# Patient Record
Sex: Female | Born: 1990 | Race: Black or African American | Hispanic: No | Marital: Single | Smoking: Never smoker
Health system: Southern US, Community
[De-identification: ages and names within clinical notes are randomized; demographics above are authoritative.]

---

## 2015-10-01 ENCOUNTER — Emergency Department (HOSPITAL_COMMUNITY)
Admission: EM | Admit: 2015-10-01 | Discharge: 2015-10-01 | Disposition: A | Discharge status: Home / Self Care | Payer: BLUE CROSS/BLUE SHIELD | Attending: Emergency Medicine | Admitting: Emergency Medicine

## 2015-10-01 ENCOUNTER — Emergency Department (HOSPITAL_COMMUNITY): Payer: BLUE CROSS/BLUE SHIELD

## 2015-10-01 ENCOUNTER — Encounter (HOSPITAL_COMMUNITY): Payer: Self-pay

## 2015-10-01 DIAGNOSIS — R1032 Left lower quadrant pain: Secondary | ICD-10-CM | POA: Diagnosis present

## 2015-10-01 DIAGNOSIS — Z3202 Encounter for pregnancy test, result negative: Secondary | ICD-10-CM | POA: Diagnosis not present

## 2015-10-01 DIAGNOSIS — D259 Leiomyoma of uterus, unspecified: Secondary | ICD-10-CM | POA: Diagnosis not present

## 2015-10-01 DIAGNOSIS — Z79818 Long term (current) use of other agents affecting estrogen receptors and estrogen levels: Secondary | ICD-10-CM | POA: Insufficient documentation

## 2015-10-01 LAB — BASIC METABOLIC PANEL
Anion gap: 6 (ref 5–15)
BUN: 8 mg/dL (ref 6–20)
CHLORIDE: 105 mmol/L (ref 101–111)
CO2: 23 mmol/L (ref 22–32)
CREATININE: 0.76 mg/dL (ref 0.44–1.00)
Calcium: 8.7 mg/dL — ABNORMAL LOW (ref 8.9–10.3)
GFR calc Af Amer: >60 mL/min (ref >60)
GFR calc non Af Amer: >60 mL/min (ref >60)
GLUCOSE: 99 mg/dL (ref 65–99)
Potassium: 3.7 mmol/L (ref 3.5–5.1)
SODIUM: 134 mmol/L — AB (ref 135–145)

## 2015-10-01 LAB — CBC WITH DIFFERENTIAL/PLATELET
Basophils Absolute: 0.1 10*3/uL (ref 0.0–0.1)
Basophils Relative: 1 %
EOS ABS: 0.3 10*3/uL (ref 0.0–0.7)
EOS PCT: 6 %
HCT: 37.9 % (ref 36.0–46.0)
Hemoglobin: 12 g/dL (ref 12.0–15.0)
LYMPHS ABS: 2.2 10*3/uL (ref 0.7–4.0)
LYMPHS PCT: 43 %
MCH: 25.5 pg — AB (ref 26.0–34.0)
MCHC: 31.7 g/dL (ref 30.0–36.0)
MCV: 80.5 fL (ref 78.0–100.0)
MONO ABS: 0.3 10*3/uL (ref 0.1–1.0)
MONOS PCT: 7 %
Neutro Abs: 2.3 10*3/uL (ref 1.7–7.7)
Neutrophils Relative %: 43 %
PLATELETS: 280 10*3/uL (ref 150–400)
RBC: 4.71 MIL/uL (ref 3.87–5.11)
RDW: 14.3 % (ref 11.5–15.5)
WBC: 5.2 10*3/uL (ref 4.0–10.5)

## 2015-10-01 LAB — URINALYSIS, ROUTINE W REFLEX MICROSCOPIC
Bilirubin Urine: NEGATIVE
GLUCOSE, UA: NEGATIVE mg/dL
Hgb urine dipstick: NEGATIVE
Ketones, ur: NEGATIVE mg/dL
LEUKOCYTES UA: NEGATIVE
Nitrite: NEGATIVE
PROTEIN: NEGATIVE mg/dL
SPECIFIC GRAVITY, URINE: 1.007 (ref 1.005–1.030)
pH: 6 (ref 5.0–8.0)

## 2015-10-01 LAB — POC URINE PREG, ED: PREG TEST UR: NEGATIVE

## 2015-10-01 MED ORDER — ONDANSETRON 4 MG PO TBDP
4.0000 mg | ORAL_TABLET | Freq: Once | ORAL | Status: AC
  Administered 2015-10-01: 4 mg via ORAL
  Filled 2015-10-01: qty 1

## 2015-10-01 MED ORDER — HYDROCODONE-ACETAMINOPHEN 5-325 MG PO TABS: 2.0000 | ORAL_TABLET | ORAL | Status: AC | PRN

## 2015-10-01 MED ORDER — OXYCODONE-ACETAMINOPHEN 5-325 MG PO TABS
1.0000 | ORAL_TABLET | Freq: Once | ORAL | Status: AC
  Administered 2015-10-01: 1 via ORAL
  Filled 2015-10-01: qty 1

## 2015-10-01 NOTE — ED Notes (Signed)
Tech at Bedside drawing blood

## 2015-10-01 NOTE — ED Provider Notes (Signed)
CSN: [id number]     Arrival date & time 10/01/15  [id number] History   First MD Initiated Contact with Patient 10/01/15 0818     Chief Complaint  Patient presents with  . Abdominal Pain   (Consider location/radiation/quality/duration/timing/severity/associated sxs/prior Treatment) The history is provided by the patient. No language interpreter was used.   Ms. Mow is a 24 y.o female with no past medical history who presents to the ED for nausea and gradual onset left lower quadrant abdominal pain for the past 12 hours. Nothing makes her symptoms better or worse. Her last menstrual period was 3 and half weeks ago. She denies being pregnant. She states she is on birth control pills. Her last bowel movement was yesterday morning. She denies any fever, vomiting, hematochezia, diarrhea, constipation, dysuria, urinary frequency, hematuria, vaginal discharge or bleeding, vaginal itching or odor.  History reviewed. No pertinent past medical history. No past surgical history on file. No family history on file. Social History  Substance Use Topics  . Smoking status: Never Smoker   . Smokeless tobacco: None  . Alcohol Use: No   OB History    No data available     Review of Systems  Constitutional: Negative for fever.  Gastrointestinal: Positive for nausea and abdominal pain. Negative for vomiting, diarrhea, constipation and blood in stool.  All other systems reviewed and are negative.     Allergies  Review of patient's allergies indicates no known allergies.  Home Medications   Prior to Admission medications   Medication Sig Start Date End Date Taking? Authorizing Provider  [place] 28 0.25-35 MG-MCG tablet Take 1 tablet by mouth daily. 09/20/15  Yes Historical Provider, MD  HYDROcodone-acetaminophen (NORCO/VICODIN) 5-325 MG tablet Take 2 tablets by mouth every 4 (four) hours as needed. 10/01/15   Juniper Snyders Patel-Mills, PA-C   BP 114/68 mmHg  Pulse 67  Temp(Src) 98.4 F (36.9 C) (Oral)   Resp 18  SpO2 100%  LMP 09/08/2015 (Approximate) Physical Exam  Constitutional: She is oriented to person, place, and time. She appears well-developed and well-nourished.  HENT:  Head: Normocephalic and atraumatic.  Eyes: Conjunctivae are normal.  Neck: Normal range of motion. Neck supple.  Cardiovascular: Normal rate, regular rhythm and normal heart sounds.   Pulmonary/Chest: Effort normal and breath sounds normal.  Abdominal: Soft. Normal appearance. She exhibits no distension. There is tenderness. There is no rebound, no guarding and no CVA tenderness.    Left lower quadrant abdominal tenderness to palpation. Normal appearance. No abdominal distention. No guarding or rebound. No CVA tenderness.  Musculoskeletal: Normal range of motion.  Neurological: She is alert and oriented to person, place, and time.  Skin: Skin is warm and dry.  Psychiatric: She has a normal mood and affect.  Nursing note and vitals reviewed.   ED Course  Procedures (including critical care time) Labs Review Labs Reviewed  BASIC METABOLIC PANEL - Abnormal; Notable for the following:    Sodium 134 (*)    Calcium 8.7 (*)    All other components within normal limits  CBC WITH DIFFERENTIAL/PLATELET - Abnormal; Notable for the following:    MCH 25.5 (*)    All other components within normal limits  URINALYSIS, ROUTINE W REFLEX MICROSCOPIC (NOT AT [facility])  POC URINE PREG, ED    Imaging Review US Transvaginal Non-ob  10/01/2015  CLINICAL DATA:  Left lower quadrant pain EXAM: TRANSABDOMINAL AND TRANSVAGINAL ULTRASOUND OF PELVIS DOPPLER ULTRASOUND OF OVARIES TECHNIQUE: Both transabdominal and transvaginal ultrasound examinations of the pelvis were  performed. Transabdominal technique was performed for global imaging of the pelvis including uterus, ovaries, adnexal regions, and pelvic cul-de-sac. It was necessary to proceed with endovaginal exam following the transabdominal exam to visualize the ovaries. Color and  duplex Doppler ultrasound was utilized to evaluate blood flow to the ovaries. COMPARISON:  None. FINDINGS: Uterus Measurements: 8.2 x 4.5 x 4.8 cm. There is a fibroid within the left side of the fundus measuring 2.5 x 2.6 x 2.9 cm. Endometrium Thickness: 1.6 cm.  No focal abnormality visualized. Right ovary Measurements: 3 x 1.6 x 2.1 cm. Normal appearance/no adnexal mass. Left ovary Measurements: 3.6 x 2.2 x 3.0 cm. Normal appearance/no adnexal mass. Pulsed Doppler evaluation of both ovaries demonstrates normal low-resistance arterial and venous waveforms. Other findings No abnormal free fluid. IMPRESSION: 1. No evidence for ovarian torsion. 2. Left fundal fibroid. Electronically Signed   By: Kerby Moors M.D.   On: 10/01/2015 12:13   US Pelvis Complete  10/01/2015  CLINICAL DATA:  Left lower quadrant pain EXAM: TRANSABDOMINAL AND TRANSVAGINAL ULTRASOUND OF PELVIS DOPPLER ULTRASOUND OF OVARIES TECHNIQUE: Both transabdominal and transvaginal ultrasound examinations of the pelvis were performed. Transabdominal technique was performed for global imaging of the pelvis including uterus, ovaries, adnexal regions, and pelvic cul-de-sac. It was necessary to proceed with endovaginal exam following the transabdominal exam to visualize the ovaries. Color and duplex Doppler ultrasound was utilized to evaluate blood flow to the ovaries. COMPARISON:  None. FINDINGS: Uterus Measurements: 8.2 x 4.5 x 4.8 cm. There is a fibroid within the left side of the fundus measuring 2.5 x 2.6 x 2.9 cm. Endometrium Thickness: 1.6 cm.  No focal abnormality visualized. Right ovary Measurements: 3 x 1.6 x 2.1 cm. Normal appearance/no adnexal mass. Left ovary Measurements: 3.6 x 2.2 x 3.0 cm. Normal appearance/no adnexal mass. Pulsed Doppler evaluation of both ovaries demonstrates normal low-resistance arterial and venous waveforms. Other findings No abnormal free fluid. IMPRESSION: 1. No evidence for ovarian torsion. 2. Left fundal fibroid.  Electronically Signed   By: Kerby Moors M.D.   On: 10/01/2015 12:13   [place] Art/ven Flow Abd Pelv Doppler  10/01/2015  CLINICAL DATA:  Left lower quadrant pain EXAM: TRANSABDOMINAL AND TRANSVAGINAL ULTRASOUND OF PELVIS DOPPLER ULTRASOUND OF OVARIES TECHNIQUE: Both transabdominal and transvaginal ultrasound examinations of the pelvis were performed. Transabdominal technique was performed for global imaging of the pelvis including uterus, ovaries, adnexal regions, and pelvic cul-de-sac. It was necessary to proceed with endovaginal exam following the transabdominal exam to visualize the ovaries. Color and duplex Doppler ultrasound was utilized to evaluate blood flow to the ovaries. COMPARISON:  None. FINDINGS: Uterus Measurements: 8.2 x 4.5 x 4.8 cm. There is a fibroid within the left side of the fundus measuring 2.5 x 2.6 x 2.9 cm. Endometrium Thickness: 1.6 cm.  No focal abnormality visualized. Right ovary Measurements: 3 x 1.6 x 2.1 cm. Normal appearance/no adnexal mass. Left ovary Measurements: 3.6 x 2.2 x 3.0 cm. Normal appearance/no adnexal mass. Pulsed Doppler evaluation of both ovaries demonstrates normal low-resistance arterial and venous waveforms. Other findings No abnormal free fluid. IMPRESSION: 1. No evidence for ovarian torsion. 2. Left fundal fibroid. Electronically Signed   By: Kerby Moors M.D.   On: 10/01/2015 12:13   I have personally reviewed and evaluated these images and lab results as part of my medical decision-making.   EKG Interpretation None      MDM   Final diagnoses:  Uterine leiomyoma, unspecified location  Patient presents for llq abdominal pain and  nausea. She is well-appearing and vitals are stable. Her labs including urine were unremarkable. She is not pregnant. Ultrasound of the pelvis, transvaginal ultrasound, and art/Ven flow US show left uterine fibroid but no evidence of ovarian cyst, ovarian torsion or abscess. I do not believe this is colitis.  She has no  vomiting or bloody bowel movements, no diarrhea, or any other symptoms that would indicate this is bowel related.  Patient was told to take ibuprofen at home for pain and Percocet for breakthrough pain. Return precautions were discussed as well as follow-up at [facility] outpatient clinic and she verbally agrees with the plan. Filed Vitals:   10/01/15 1050 10/01/15 1236  BP: 113/68 114/68  Pulse: 77 67  Temp:  98.4 F (36.9 C)  Resp: 16 18   Medications  ondansetron (ZOFRAN-ODT) disintegrating tablet 4 mg (4 mg Oral Given 10/01/15 0838)  oxyCODONE-acetaminophen (PERCOCET/ROXICET) 5-325 MG per tablet 1 tablet (1 tablet Oral Given 10/01/15 0931)       Ottie Glazier, PA-C [phone]  Delora Fuel, MD [phone]

## 2015-10-01 NOTE — Discharge Instructions (Signed)
Uterine Fibroids Follow-up with women's outpatient clinic. Take Tylenol or Motrin for pain and hydrocodone for breakthrough pain. Uterine fibroids are tissue masses (tumors) that can develop in the womb (uterus). They are also called leiomyomas. This type of tumor is not cancerous (benign) and does not spread to other parts of the body outside of the pelvic area, which is between the hip bones. Occasionally, fibroids may develop in the fallopian tubes, in the cervix, or on the support structures (ligaments) that surround the uterus. You can have one or many fibroids. Fibroids can vary in size, weight, and where they grow in the uterus. Some can become quite large. Most fibroids do not require medical treatment. CAUSES A fibroid can develop when a single uterine cell keeps growing (replicating). Most cells in the human body have a control mechanism that keeps them from replicating without control. SIGNS AND SYMPTOMS Symptoms may include:   Heavy bleeding during your period.  Bleeding or spotting between periods.  Pelvic pain and pressure.  Bladder problems, such as needing to urinate more often (urinary frequency) or urgently.  Inability to reproduce offspring (infertility).  Miscarriages. DIAGNOSIS Uterine fibroids are diagnosed through a physical exam. Your health care provider may feel the lumpy tumors during a pelvic exam. Ultrasonography and an MRI may be done to determine the size, location, and number of fibroids. TREATMENT Treatment may include:  Watchful waiting. This involves getting the fibroid checked by your health care provider to see if it grows or shrinks. Follow your health care provider's recommendations for how often to have this checked.  Hormone medicines. These can be taken by mouth or given through an intrauterine device (IUD).  Surgery.  Removing the fibroids (myomectomy) or the uterus (hysterectomy).  Removing blood supply to the fibroids (uterine artery  embolization). If fibroids interfere with your fertility and you want to become pregnant, your health care provider may recommend having the fibroids removed.  HOME CARE INSTRUCTIONS  Keep all follow-up visits as directed by your health care provider. This is important.  Take medicines only as directed by your health care provider.  If you were prescribed a hormone treatment, take the hormone medicines exactly as directed.  Do not take aspirin, because it can cause bleeding.  Ask your health care provider about taking iron pills and increasing the amount of dark green, leafy vegetables in your diet. These actions can help to boost your blood iron levels, which may be affected by heavy menstrual bleeding.  Pay close attention to your period and tell your health care provider about any changes, such as:  Increased blood flow that requires you to use more pads or tampons than usual per month.  A change in the number of days that your period lasts per month.  A change in symptoms that are associated with your period, such as abdominal cramping or back pain. SEEK MEDICAL CARE IF:  You have pelvic pain, back pain, or abdominal cramps that cannot be controlled with medicines.  You have an increase in bleeding between and during periods.  You soak tampons or pads in a half hour or less.  You feel lightheaded, extra tired, or weak. SEEK IMMEDIATE MEDICAL CARE IF:  You faint.  You have a sudden increase in pelvic pain.   This information is not intended to replace advice given to you by your health care provider. Make sure you discuss any questions you have with your health care provider.   Document Released: 09/21/2000 Document Revised: 10/15/2014 Document  Reviewed: 03/23/2014 Elsevier Interactive Patient Education [employer].

## 2015-10-01 NOTE — ED Notes (Signed)
Patient transported to Ultrasound 

## 2015-10-01 NOTE — ED Notes (Signed)
She c/o llq abd. Pain x 12 hours.  She denies fever/dysuria and does c/o nausea, but denies vomiting.  She also denies diarrhea.

## 2015-10-01 NOTE — ED Notes (Addendum)
Transferred to [place]

## 2016-04-13 IMAGING — US US TRANSVAGINAL NON-OB
1 series · 13 of 25 positions shown · non-contrast
Comparison: None.

CLINICAL DATA: Left lower quadrant pain

EXAM:
TRANSABDOMINAL AND TRANSVAGINAL ULTRASOUND OF PELVIS
DOPPLER ULTRASOUND OF OVARIES
TECHNIQUE: Both transabdominal and transvaginal ultrasound examinations of the
pelvis were performed. Transabdominal technique was performed for
global imaging of the pelvis including uterus, ovaries, adnexal
regions, and pelvic cul-de-sac.
It was necessary to proceed with endovaginal exam following the
transabdominal exam to visualize the ovaries.. Color and duplex
Doppler ultrasound was utilized to evaluate blood flow to the
ovaries.

[Series 1: us transvaginal non-ob · 0.24mm/px · 64 acquisitions, 13 frames shown]
[im 1/64]
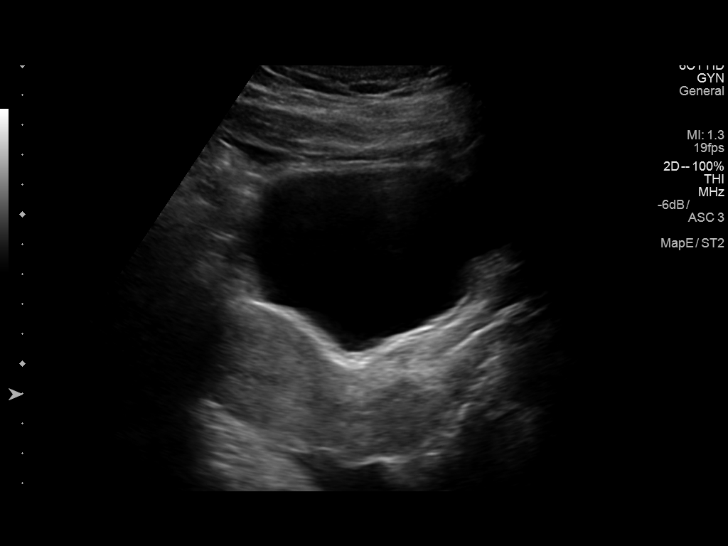
[im 6/64]
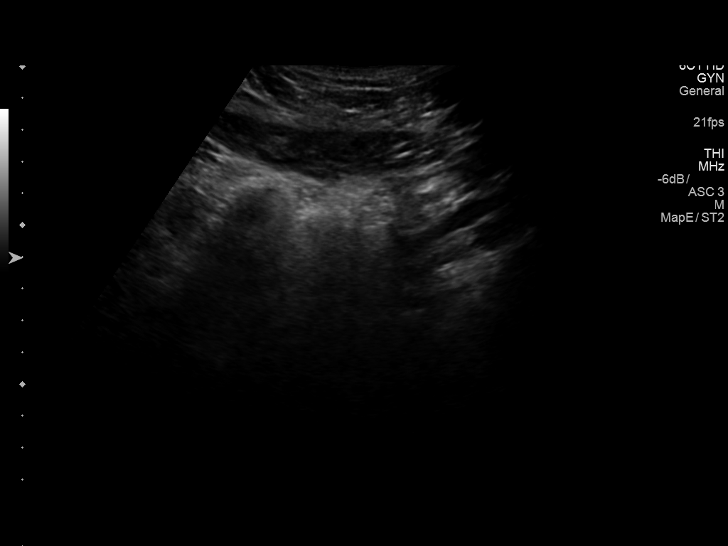
[im 11/64]
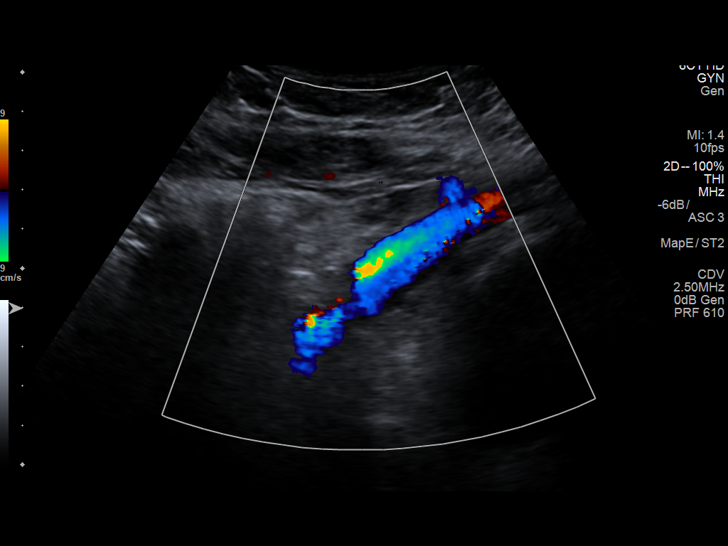
[im 16/64]
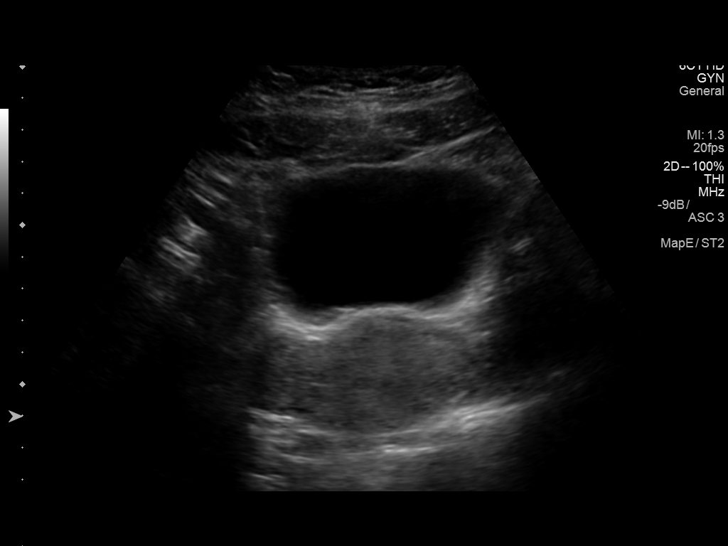
[im 22/64]
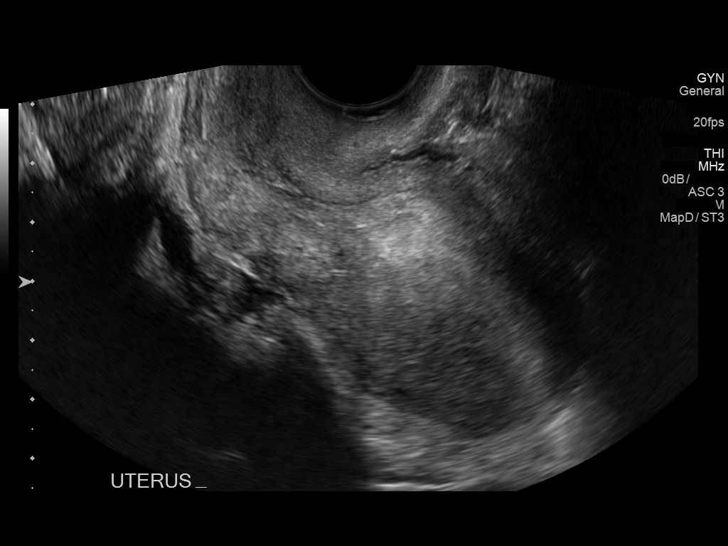
[im 27/64]
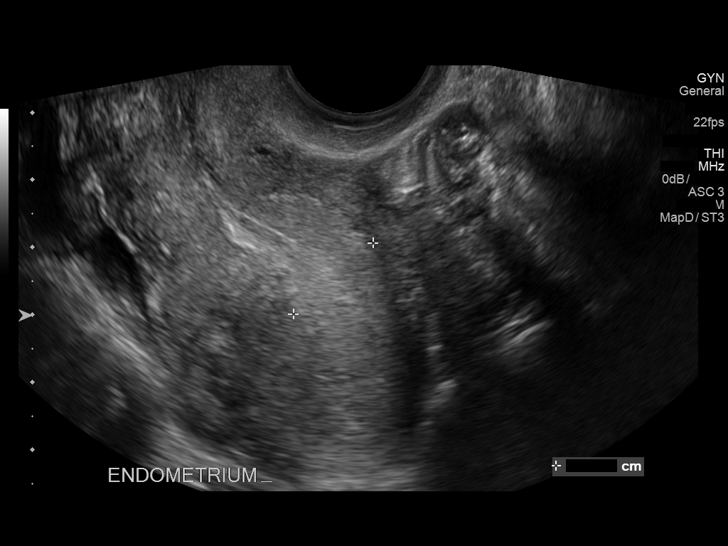
[im 32/64]
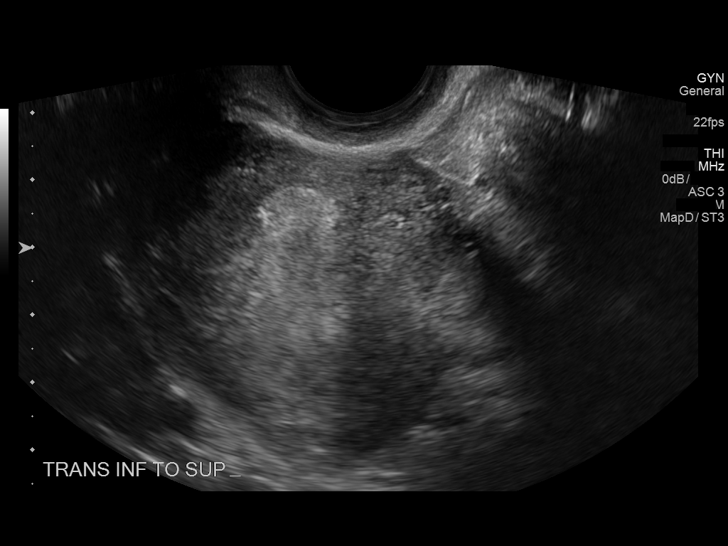
[im 37/64]
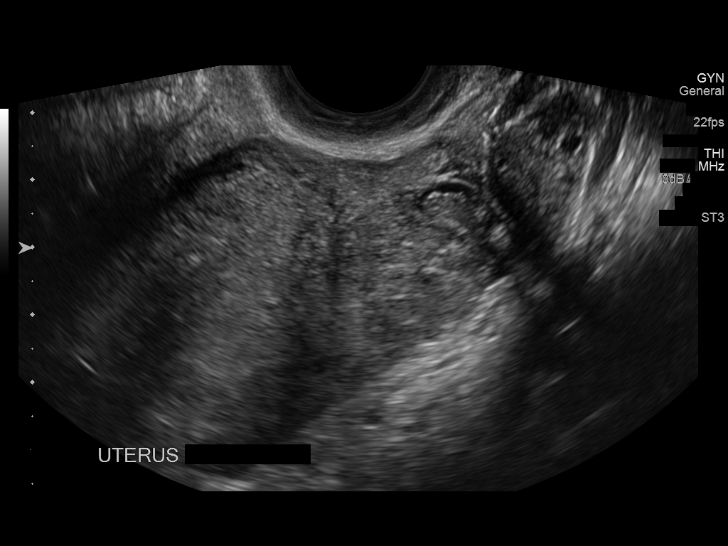
[im 43/64]
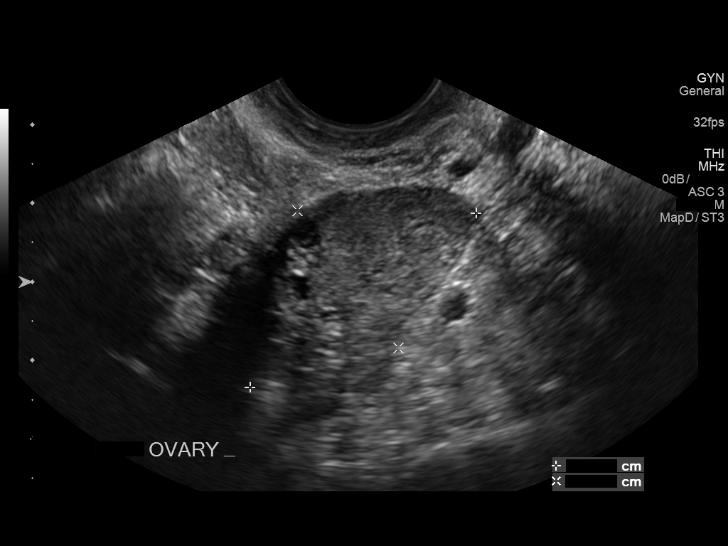
[im 48/64]
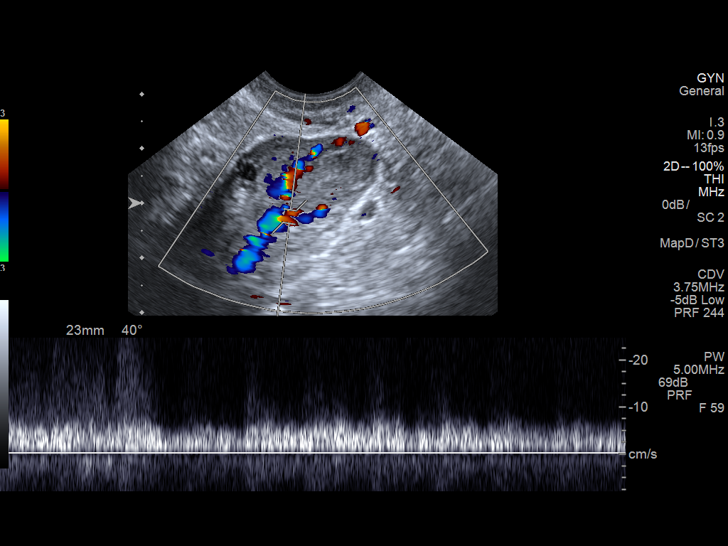
[im 53/64]
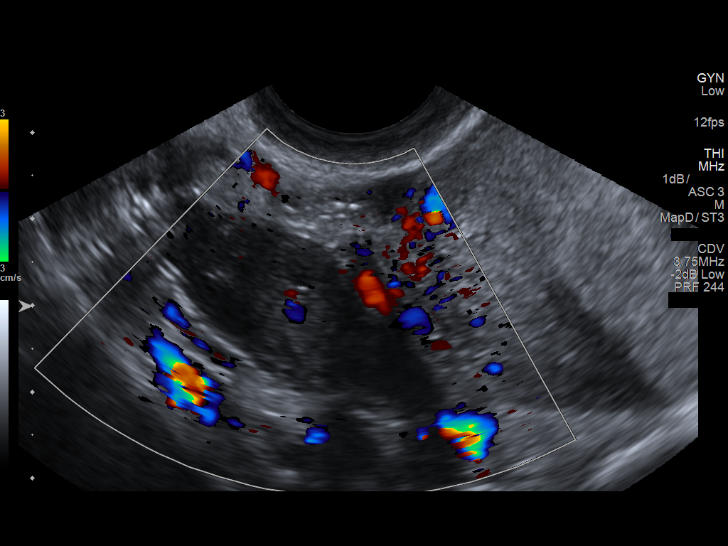
[im 58/64]
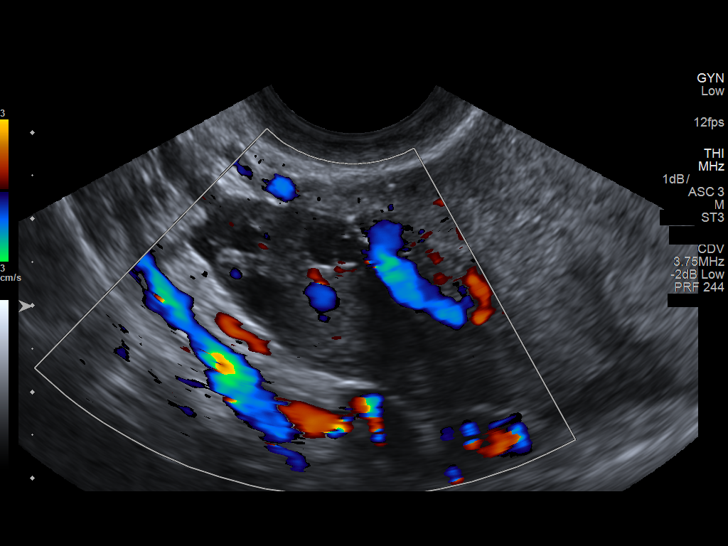
[im 64/64]
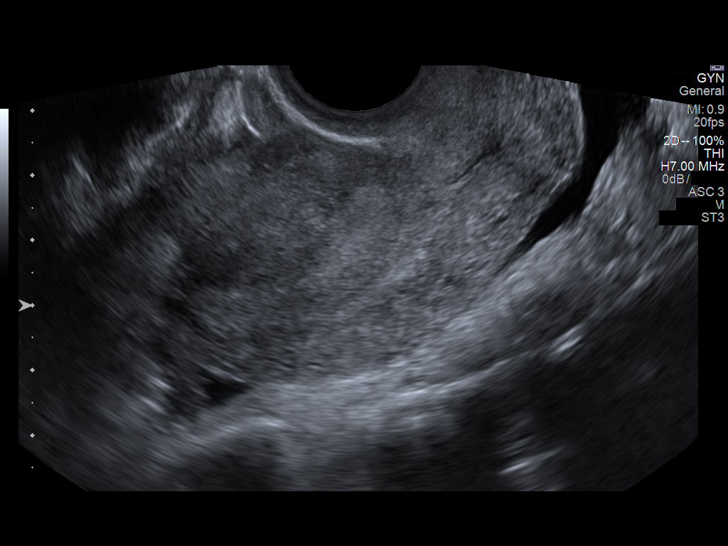

[13 of 25 positions shown; findings below may reference images not displayed]

FINDINGS: Uterus

Measurements: 8.2 x 4.5 x 4.8 cm. There is a fibroid within the left
side of the fundus measuring 2.5 x 2.6 x 2.9 cm.

Endometrium

Thickness: 1.6 cm..  No focal abnormality visualized.

Right ovary

Measurements: 3 x 1.6 x 2.1 cm. Normal appearance/no adnexal mass.

Left ovary

Measurements: 3.6 x 2.2 x 3.0 cm.. Normal appearance/no adnexal
mass.

Pulsed Doppler evaluation of both ovaries demonstrates normal
low-resistance arterial and venous waveforms.

Other findings

No abnormal free fluid.
IMPRESSION: 1. No evidence for ovarian torsion.
2. Left fundal fibroid.

## 2019-05-11 ENCOUNTER — Encounter (HOSPITAL_COMMUNITY): Payer: Self-pay | Admitting: *Deleted

## 2019-05-11 ENCOUNTER — Inpatient Hospital Stay
Admission: RE | Admit: 2019-05-11 | Discharge: 2019-05-11 | Disposition: A | Discharge status: Home / Self Care | Payer: BLUE CROSS/BLUE SHIELD | Source: Ambulatory Visit

## 2019-05-11 ENCOUNTER — Other Ambulatory Visit: Payer: Self-pay

## 2019-05-11 ENCOUNTER — Ambulatory Visit (HOSPITAL_COMMUNITY)
Admission: EM | Admit: 2019-05-11 | Discharge: 2019-05-11 | Disposition: A | Discharge status: Home / Self Care | Payer: Managed Care, Other (non HMO) | Attending: Emergency Medicine | Admitting: Emergency Medicine

## 2019-05-11 DIAGNOSIS — Z3202 Encounter for pregnancy test, result negative: Secondary | ICD-10-CM

## 2019-05-11 DIAGNOSIS — R1032 Left lower quadrant pain: Secondary | ICD-10-CM | POA: Insufficient documentation

## 2019-05-11 LAB — POCT URINALYSIS DIP (DEVICE)
Bilirubin Urine: NEGATIVE
Glucose, UA: NEGATIVE mg/dL
Ketones, ur: NEGATIVE mg/dL
Nitrite: NEGATIVE
Protein, ur: NEGATIVE mg/dL
Specific Gravity, Urine: >=1.03 (ref 1.005–1.030)
Urobilinogen, UA: 0.2 mg/dL (ref 0.0–1.0)
pH: 6 (ref 5.0–8.0)

## 2019-05-11 LAB — POCT PREGNANCY, URINE: Preg Test, Ur: NEGATIVE

## 2019-05-11 MED ORDER — POLYETHYLENE GLYCOL 3350 17 GM/SCOOP PO POWD: 17.0000 g | Freq: Every day | ORAL | 0 refills | Status: AC

## 2019-05-11 MED ORDER — SIMETHICONE 80 MG PO CHEW: 80.0000 mg | CHEWABLE_TABLET | Freq: Four times a day (QID) | ORAL | 0 refills | Status: AC | PRN

## 2019-05-11 NOTE — ED Triage Notes (Signed)
Patient reports abdominal pain, points to left lower quadrant, x several years. States that she was told it was fibroids. States that flatuance and bowel movements relieve discomfort. Patient has seen several providers for same and is unable to get diagnosis that helps. No pain with movement. States pain is sharp pain that can last 2-4 minutes have orgasm, will only last a couple seconds otherwise.

## 2019-05-11 NOTE — ED Provider Notes (Signed)
[place]    CSN: [id number] Arrival date & time: 05/11/19  5364      History   Chief Complaint Chief Complaint  Patient presents with  . Abdominal Pain    HPI Aimee Mills is a 28 y.o. female.   Aimee Mills presents with complaints of LLQ pain. It is sharp. Comes and goes. It feels better after passing gas or having a BM. She states that she has some constipation, yesterday had to strain to pass a BM, then this was followed by two looser stools. No blood in stool. Laying in the fetal position and passing gas can relieve the pain as well. She additionally feels it after she has an orgasm. No pain with intercourse. No vaginal complaints. She does have some urinary frequency. LMP 7/13. She is not on birthcontrol. She has seen her gynecologist for this in the past with imaging, saw a very small uterine leiomyoma which was not thought to be contributing to symptoms. She also completed pelvic floor physical therapy and was given intravaginal valium as they thought she was experiencing levator spasms. Has not had fevers. No current pain. No nausea or vomiting. Sometimes eating worsens the pain. States she eats a fairly healthy diet. Hasn't tried any other medications for symptoms. Hasn't seen her PCP or gyne in approximately 1 year.     ROS per HPI, negative if not otherwise mentioned.      History reviewed. No pertinent past medical history.  There are no active problems to display for this patient.   History reviewed. No pertinent surgical history.  OB History   No obstetric history on file.      Home Medications    Prior to Admission medications   Medication Sig Start Date End Date Taking? Authorizing Provider  HYDROcodone-acetaminophen (NORCO/VICODIN) 5-325 MG tablet Take 2 tablets by mouth every 4 (four) hours as needed. 10/01/15   Patel-Mills, Orvil Feil, PA-C  polyethylene glycol powder (GLYCOLAX/MIRALAX) 17 GM/SCOOP powder Take 17 g by mouth daily. 05/11/19    Zigmund Gottron, NP  simethicone (GAS-X) 80 MG chewable tablet Chew 1 tablet (80 mg total) by mouth every 6 (six) hours as needed for flatulence. 05/11/19   Zigmund Gottron, NP  [place] 28 0.25-35 MG-MCG tablet Take 1 tablet by mouth daily. 09/20/15   [provider]    Family History Family History  Problem Relation Age of Onset  . Healthy Mother   . Healthy Father     Social History Social History   Tobacco Use  . Smoking status: Never Smoker  Substance Use Topics  . Alcohol use: No  . Drug use: Not on file     Allergies   Patient has no known allergies.   Review of Systems Review of Systems   Physical Exam Triage Vital Signs ED Triage Vitals  Enc Vitals Group     BP 05/11/19 0945 125/75     Pulse Rate 05/11/19 0945 83     Resp 05/11/19 0945 16     Temp 05/11/19 0944 98.2 F (36.8 C)     Temp src --      SpO2 05/11/19 0945 98 %     Weight --      Height --      Head Circumference --      Peak Flow --      Pain Score 05/11/19 0949 4     Pain Loc --      Pain Edu? --  Excl. in GC? --    No data found.  Updated Vital Signs BP 125/75 (BP Location: Right Arm)   Pulse 83   Temp 98.2 F (36.8 C)   Resp 16   LMP 04/20/2019   SpO2 98%    Physical Exam Constitutional:      General: She is not in acute distress.    Appearance: She is well-developed.  Cardiovascular:     Rate and Rhythm: Normal rate and regular rhythm.     Heart sounds: Normal heart sounds.  Pulmonary:     Effort: Pulmonary effort is normal.     Breath sounds: Normal breath sounds.  Abdominal:     Palpations: Abdomen is soft. Abdomen is not rigid.     Tenderness: There is no abdominal tenderness. There is no guarding or rebound.  Genitourinary:    Comments: Denies sores, lesions, vaginal bleeding; no pelvic pain; gu exam deferred at this time, vaginal self swab collected.   Skin:    General: Skin is warm and dry.  Neurological:     Mental Status: She is alert and  oriented to person, place, and time.      UC Treatments / Results  Labs (all labs ordered are listed, but only abnormal results are displayed) Labs Reviewed  POCT URINALYSIS DIP (DEVICE) - Abnormal; Notable for the following components:      Result Value   Hgb urine dipstick TRACE (*)    Leukocytes,Ua TRACE (*)    All other components within normal limits  URINE CULTURE  POC URINE PREG, ED  POCT PREGNANCY, URINE  CERVICOVAGINAL ANCILLARY ONLY    EKG   Radiology No results found.  Procedures Procedures (including critical care time)  Medications Ordered in UC Medications - No data to display  Initial Impression / Assessment and Plan / UC Course  I have reviewed the triage vital signs and the nursing notes.  Pertinent labs & imaging results that were available during my care of the patient were reviewed by me and considered in my medical decision making (see chart for details).     No indications of acute surgical abdomen. Symptoms do sound more GI related rather than GU. Urine with hgb and leuks, culture obtained and vaginal cytology also collected. Preventing constipation discussed, diet log encouraged, and gas treatment recommended. Encouraged continued follow up with PCP and/or gyn. Return precautions provided. Patient verbalized understanding and agreeable to plan.   Final Clinical Impressions(s) / UC Diagnoses   Final diagnoses:  Left lower quadrant abdominal pain     Discharge Instructions     Your urine is overall reassuring.  We will add in testing of the vagina as well to ensure no potential vaginal infectious sources.  I think treating any constipation, as well as treating gas, may help with your symptoms.  Miralax daily as needed, may be adjusted based on your response. Drink plenty of water.  Simthicone as needed.  Monitor your diet in relationship to your symptoms to see if there is any potential correlation.  Please follow up with your primary care  provider/ gynecologist for recheck if symptoms persist.    ED Prescriptions    Medication Sig Dispense Auth. Provider   polyethylene glycol powder (GLYCOLAX/MIRALAX) 17 GM/SCOOP powder Take 17 g by mouth daily. 255 g Augusto Gamble B, NP   simethicone (GAS-X) 80 MG chewable tablet Chew 1 tablet (80 mg total) by mouth every 6 (six) hours as needed for flatulence. 30 tablet Zigmund Gottron, NP  Controlled Substance Prescriptions [place] Controlled Substance Registry consulted? Not Applicable   Zigmund Gottron, NP 05/11/19 1125

## 2019-05-11 NOTE — Discharge Instructions (Signed)
Your urine is overall reassuring.  We will add in testing of the vagina as well to ensure no potential vaginal infectious sources.  I think treating any constipation, as well as treating gas, may help with your symptoms.  Miralax daily as needed, may be adjusted based on your response. Drink plenty of water.  Simthicone as needed.  Monitor your diet in relationship to your symptoms to see if there is any potential correlation.  Please follow up with your primary care provider/ gynecologist for recheck if symptoms persist.

## 2019-05-12 LAB — URINE CULTURE

## 2019-05-13 LAB — CERVICOVAGINAL ANCILLARY ONLY
Candida vaginitis: NEGATIVE
Chlamydia: NEGATIVE
Neisseria Gonorrhea: NEGATIVE
Trichomonas: NEGATIVE

## 2019-05-15 ENCOUNTER — Telehealth (HOSPITAL_COMMUNITY): Payer: Self-pay | Admitting: Emergency Medicine

## 2019-05-15 MED ORDER — METRONIDAZOLE 500 MG PO TABS: 500.0000 mg | ORAL_TABLET | Freq: Two times a day (BID) | ORAL | 0 refills | Status: AC

## 2019-05-15 NOTE — Telephone Encounter (Signed)
Bacterial vaginosis is positive. This was not treated at the urgent care visit.  Flagyl 500 mg BID x 7 days #14 no refills sent to patients pharmacy of choice.    Patient contacted and made aware of    results, all questions answered

## 2019-06-06 ENCOUNTER — Other Ambulatory Visit: Payer: Self-pay

## 2019-06-06 ENCOUNTER — Emergency Department (HOSPITAL_COMMUNITY)
Admission: EM | Admit: 2019-06-06 | Discharge: 2019-06-06 | Disposition: A | Discharge status: Home / Self Care | Payer: Managed Care, Other (non HMO) | Attending: Emergency Medicine | Admitting: Emergency Medicine

## 2019-06-06 ENCOUNTER — Encounter (HOSPITAL_COMMUNITY): Payer: Self-pay | Admitting: Emergency Medicine

## 2019-06-06 DIAGNOSIS — N898 Other specified noninflammatory disorders of vagina: Secondary | ICD-10-CM

## 2019-06-06 DIAGNOSIS — Z79899 Other long term (current) drug therapy: Secondary | ICD-10-CM | POA: Diagnosis not present

## 2019-06-06 DIAGNOSIS — Z202 Contact with and (suspected) exposure to infections with a predominantly sexual mode of transmission: Secondary | ICD-10-CM

## 2019-06-06 DIAGNOSIS — Z7689 Persons encountering health services in other specified circumstances: Secondary | ICD-10-CM

## 2019-06-06 LAB — URINALYSIS, ROUTINE W REFLEX MICROSCOPIC
Bilirubin Urine: NEGATIVE
Glucose, UA: NEGATIVE mg/dL
Hgb urine dipstick: NEGATIVE
Ketones, ur: NEGATIVE mg/dL
Leukocytes,Ua: NEGATIVE
Nitrite: NEGATIVE
Protein, ur: NEGATIVE mg/dL
Specific Gravity, Urine: 1.018 (ref 1.005–1.030)
pH: 5 (ref 5.0–8.0)

## 2019-06-06 LAB — WET PREP, GENITAL
Clue Cells Wet Prep HPF POC: NONE SEEN
Sperm: NONE SEEN
Trich, Wet Prep: NONE SEEN
Yeast Wet Prep HPF POC: NONE SEEN

## 2019-06-06 LAB — I-STAT BETA HCG BLOOD, ED (MC, WL, AP ONLY): I-stat hCG, quantitative: <5 m[IU]/mL (ref <5)

## 2019-06-06 MED ORDER — LIDOCAINE HCL (PF) 1 % IJ SOLN
INTRAMUSCULAR | Status: AC
  Administered 2019-06-06: 5 mL
  Filled 2019-06-06: qty 5

## 2019-06-06 MED ORDER — CEFTRIAXONE SODIUM 250 MG IJ SOLR
250.0000 mg | Freq: Once | INTRAMUSCULAR | Status: AC
  Administered 2019-06-06: 22:00:00 250 mg via INTRAMUSCULAR
  Filled 2019-06-06: qty 250

## 2019-06-06 MED ORDER — AZITHROMYCIN 250 MG PO TABS
1000.0000 mg | ORAL_TABLET | Freq: Once | ORAL | Status: AC
  Administered 2019-06-06: 1000 mg via ORAL
  Filled 2019-06-06: qty 4

## 2019-06-06 NOTE — ED Triage Notes (Signed)
Pt here for STD check. States she is sexually active and has vaginal irritation and discomfort to her throat that started at the same time. Denies vaginal discharge. LMP 8/16.

## 2019-06-06 NOTE — ED Notes (Signed)
Patient verbalizes understanding of discharge instructions. Opportunity for questioning and answers were provided. Armband removed by staff, pt discharged from ED ambulatory by self\  

## 2019-06-06 NOTE — ED Provider Notes (Signed)
[facility] EMERGENCY DEPARTMENT Provider Note   CSN: [id number] Arrival date & time: 06/06/19  [id number]    History   Chief Complaint Vaginal discharge  HPI Aimee Mills is a 28 y.o. female with no significant past medical history who presents for evaluation of STD screening.  Patient states she is sexually active with her boyfriend.  They normally use condoms however the condom broke last week.  Patient states she has had some mild vaginal pruritus and a clear vaginal discharge since that time.  Patient states she did perform oral sex on her boyfriend and initially had a scratchy sensation to her throat 3 days ago however has none currently.  Denies fever, chills, nausea, vomiting, chest pain, shortness of breath, abdominal pain, pelvic pain, dysuria, constipation, abnormal vaginal bleeding, diarrhea.  Denies prior history of STDs.  LMP 8/16.  Has not taken anything for her symptoms.  She is not followed by OB/GYN.  Tolerating p.o. intake without difficulty.  Denies any drooling, dysphasia, trismus, sore throat, oral lesions.  History obtained from patient and past medical records.  No interpreter was used.     HPI  History reviewed. No pertinent past medical history.  There are no active problems to display for this patient.   History reviewed. No pertinent surgical history.   OB History   No obstetric history on file.      Home Medications    Prior to Admission medications   Medication Sig Start Date End Date Taking? Authorizing Provider  HYDROcodone-acetaminophen (NORCO/VICODIN) 5-325 MG tablet Take 2 tablets by mouth every 4 (four) hours as needed. 10/01/15   Patel-Mills, Orvil Feil, PA-C  polyethylene glycol powder (GLYCOLAX/MIRALAX) 17 GM/SCOOP powder Take 17 g by mouth daily. 05/11/19   Zigmund Gottron, NP  simethicone (GAS-X) 80 MG chewable tablet Chew 1 tablet (80 mg total) by mouth every 6 (six) hours as needed for flatulence. 05/11/19   Zigmund Gottron, NP   SPRINTEC 28 0.25-35 MG-MCG tablet Take 1 tablet by mouth daily. 09/20/15   [provider]    Family History Family History  Problem Relation Age of Onset  . Healthy Mother   . Healthy Father     Social History Social History   Tobacco Use  . Smoking status: Never Smoker  Substance Use Topics  . Alcohol use: No  . Drug use: Not on file     Allergies   Patient has no known allergies.   Review of Systems Review of Systems  Constitutional: Negative.   HENT: Negative.   Eyes: Negative.   Respiratory: Negative.   Cardiovascular: Negative.   Gastrointestinal: Negative.   Genitourinary: Positive for vaginal discharge. Negative for decreased urine volume, difficulty urinating, flank pain, frequency, hematuria, menstrual problem, pelvic pain, urgency, vaginal bleeding and vaginal pain.  Musculoskeletal: Negative.   Skin: Negative.   Neurological: Negative.   All other systems reviewed and are negative.    Physical Exam Updated Vital Signs BP 126/72   Pulse 94   Temp 99.3 F (37.4 C) (Oral)   Resp 16   LMP 05/24/2019   SpO2 100%   Physical Exam Vitals signs and nursing note reviewed. Exam conducted with a chaperone present.  Constitutional:      General: She is not in acute distress.    Appearance: She is well-developed. She is not ill-appearing, toxic-appearing or diaphoretic.  HENT:     Head: Normocephalic and atraumatic.     Nose: Nose normal.     Mouth/Throat:  Mouth: Mucous membranes are moist.     Pharynx: Oropharynx is clear.  Eyes:     Pupils: Pupils are equal, round, and reactive to light.  Neck:     Musculoskeletal: Normal range of motion.  Cardiovascular:     Rate and Rhythm: Normal rate.     Pulses: Normal pulses.     Heart sounds: Normal heart sounds.  Pulmonary:     Effort: Pulmonary effort is normal. No respiratory distress.     Breath sounds: Normal breath sounds.  Abdominal:     General: Bowel sounds are normal. There is no  distension.     Palpations: There is no mass.     Tenderness: There is no abdominal tenderness. There is no guarding or rebound.     Hernia: No hernia is present. There is no hernia in the left inguinal area or right inguinal area.     Comments: Soft, nontender without rebound or guarding.  Normoactive  bowel sounds.  No overlying skin changes to abdominal wall.  Genitourinary:    Pubic Area: No rash.      Labia:        Right: No rash, tenderness, lesion or injury.        Left: No rash, tenderness, lesion or injury.      Vagina: Normal.     Cervix: Discharge and friability present.     Uterus: Normal.      Adnexa: Right adnexa normal and left adnexa normal.     Comments: Normal appearing external female genitalia without rashes or lesions, normal vaginal epithelium. Normal appearing cervix with white, light yellow discharge. No cervical petechiae. Cervical os is closed. There is no bleeding noted at the os.No Odor. Bimanual: No CMT, nontender.  No palpable adnexal masses or tenderness. Uterus midline and not fixed. Rectovaginal exam was deferred.  No cystocele or rectocele noted. No pelvic lymphadenopathy noted. Wet prep was obtained.  Cultures for gonorrhea and chlamydia collected. Exam performed with chaperone in room. Musculoskeletal: Normal range of motion.     Comments: Moves all 4 extremities without difficulty.  Lymphadenopathy:     Lower Body: No right inguinal adenopathy. No left inguinal adenopathy.  Skin:    General: Skin is warm and dry.     Comments: No rashes or lesions.  Brisk capillary refill.  Neurological:     Mental Status: She is alert.     ED Treatments / Results  Labs (all labs ordered are listed, but only abnormal results are displayed) Labs Reviewed  WET PREP, GENITAL - Abnormal; Notable for the following components:      Result Value   WBC, Wet Prep HPF POC MANY (*)    All other components within normal limits  URINE CULTURE  URINALYSIS, ROUTINE W REFLEX  MICROSCOPIC  RPR  HIV ANTIBODY (ROUTINE TESTING W REFLEX)  I-STAT BETA HCG BLOOD, ED ([facility], [facility], [facility] ONLY)  GC/CHLAMYDIA PROBE AMP ([place]) NOT AT [facility]    EKG None  Radiology No results found.  Procedures Procedures (including critical care time)  Medications Ordered in ED Medications  cefTRIAXone (ROCEPHIN) injection 250 mg (250 mg Intramuscular Given 06/06/19 2210)  azithromycin (ZITHROMAX) tablet 1,000 mg (1,000 mg Oral Given 06/06/19 2210)  lidocaine (PF) (XYLOCAINE) 1 % injection (5 mLs  Given 06/06/19 2210)   Initial Impression / Assessment and Plan / ED Course  I have reviewed the triage vital signs and the nursing notes.  Pertinent labs & imaging results that were available during my  care of the patient were reviewed by me and considered in my medical decision making (see chart for details).  28 year old female appears otherwise well presents for evaluation of requesting STD screening.  She is afebrile, nonseptic, non-ill-appearing.  Sexually active with one partner.  Uses protection, however condom broke last week.  She has had clear vaginal discharge as well as vaginal pruritus.  Had a scratchy throat after performing oral sex last week however none since.  No oral lesions.  Tolerating p.o. intake without difficulty.  Posterior oropharynx clear.  Low suspicion for pharyngitis of her scratchy throat last week.  Mucous membranes moist.  Abdomen soft, nontender without rebound or guarding.  GU exam with white discharge in vaginal vault. No CMT, adnexal tenderness.  Pt presents with concerns for possible STD.  Pt understands that they have GC/Chlamydia cultures pending and that they will need to inform all sexual partners if results return positive. Pt has been treated prophylactically with azithromycin and Rocephin due to pts history, pelvic exam, and wet prep with increased WBCs. Pt not concerning for PID, torsion, TOA because hemodynamically stable and no cervical motion  tenderness on pelvic exam. Pt has also been treated with Flagyl for Bacterial Vaginosis. Pt has been advised to not drink alcohol while on this medication.  Patient to be discharged with instructions to follow up with OBGYN/PCP. Discussed importance of using protection when sexually active.   The patient has been appropriately medically screened and/or stabilized in the ED. I have low suspicion for any other emergent medical condition which would require further screening, evaluation or treatment in the ED or require inpatient management.      Final Clinical Impressions(s) / ED Diagnoses   Final diagnoses:  Vaginal discharge  Encounter for assessment of STD exposure    ED Discharge Orders    None       Trinton Prewitt A, PA-C 06/06/19 2214    Margette Fast, MD 06/07/19 [phone]

## 2019-06-06 NOTE — Discharge Instructions (Addendum)
You will be called for the results of your STD screening.  If these are positive you will need to notify your partners.  If you develop severe abdominal pain, throwing up, high fevers please seek reevaluation.

## 2019-06-07 LAB — HIV ANTIBODY (ROUTINE TESTING W REFLEX): HIV Screen 4th Generation wRfx: NONREACTIVE

## 2019-06-07 LAB — RPR: RPR Ser Ql: NONREACTIVE

## 2019-06-08 ENCOUNTER — Emergency Department (HOSPITAL_COMMUNITY)
Admission: EM | Admit: 2019-06-08 | Discharge: 2019-06-08 | Disposition: A | Discharge status: Home / Self Care | Payer: Managed Care, Other (non HMO) | Attending: Emergency Medicine | Admitting: Emergency Medicine

## 2019-06-08 ENCOUNTER — Other Ambulatory Visit: Payer: Self-pay

## 2019-06-08 DIAGNOSIS — J029 Acute pharyngitis, unspecified: Secondary | ICD-10-CM | POA: Insufficient documentation

## 2019-06-08 DIAGNOSIS — Z79899 Other long term (current) drug therapy: Secondary | ICD-10-CM | POA: Diagnosis not present

## 2019-06-08 DIAGNOSIS — R509 Fever, unspecified: Secondary | ICD-10-CM | POA: Diagnosis not present

## 2019-06-08 DIAGNOSIS — M545 Low back pain: Secondary | ICD-10-CM | POA: Diagnosis not present

## 2019-06-08 DIAGNOSIS — Z20828 Contact with and (suspected) exposure to other viral communicable diseases: Secondary | ICD-10-CM | POA: Diagnosis not present

## 2019-06-08 DIAGNOSIS — R0981 Nasal congestion: Secondary | ICD-10-CM | POA: Diagnosis not present

## 2019-06-08 DIAGNOSIS — R070 Pain in throat: Secondary | ICD-10-CM | POA: Diagnosis present

## 2019-06-08 LAB — URINALYSIS, ROUTINE W REFLEX MICROSCOPIC
Bilirubin Urine: NEGATIVE
Glucose, UA: NEGATIVE mg/dL
Hgb urine dipstick: NEGATIVE
Ketones, ur: NEGATIVE mg/dL
Nitrite: NEGATIVE
Protein, ur: 30 mg/dL — AB
Specific Gravity, Urine: 1.026 (ref 1.005–1.030)
pH: 7 (ref 5.0–8.0)

## 2019-06-08 LAB — URINE CULTURE: Culture: <10000 — AB

## 2019-06-08 LAB — GROUP A STREP BY PCR: Group A Strep by PCR: NOT DETECTED

## 2019-06-08 MED ORDER — ACETAMINOPHEN 325 MG PO TABS
650.0000 mg | ORAL_TABLET | Freq: Once | ORAL | Status: AC
  Administered 2019-06-08: 18:00:00 650 mg via ORAL
  Filled 2019-06-08: qty 2

## 2019-06-08 MED ORDER — AMOXICILLIN 500 MG PO CAPS: 500.0000 mg | ORAL_CAPSULE | Freq: Two times a day (BID) | ORAL | 0 refills | Status: AC

## 2019-06-08 NOTE — ED Notes (Signed)
Discharge instructions and prescription discussed with Pt. Pt verbalized understanding. Pt stable and ambulatory.    

## 2019-06-08 NOTE — ED Triage Notes (Signed)
Patient c/o sore throat and fever onset of Saturday. Patient states she has fever of 102 earlier today . Last took ibuprofen about 2 hours ago. Patient was given rocephin shot 2 when last seen.

## 2019-06-08 NOTE — ED Provider Notes (Signed)
[facility] EMERGENCY DEPARTMENT Provider Note   CSN: [id number] Arrival date & time: 06/08/19  1806     History   Chief Complaint Chief Complaint  Patient presents with  . Fever  . Sore Throat    HPI Aimee Mills is a 28 y.o. female.     The history is provided by the patient and medical records. No language interpreter was used.  Fever Associated symptoms: chills and sore throat   Sore Throat   Aimee Mills is a 28 y.o. female who presents to the Emergency Department complaining of sore throat for 2 or 3 days as well as nasal congestion.  This morning, felt feverish.  101.2 in triage.  Denies known sick contacts or coronavirus exposure.  No abdominal pain, nausea or vomiting.  Does endorse some left-sided low back pain.  No cough, chest pain or shortness of breath.   No past medical history on file.  There are no active problems to display for this patient.   No past surgical history on file.   OB History   No obstetric history on file.      Home Medications    Prior to Admission medications   Medication Sig Start Date End Date Taking? Authorizing Provider  amoxicillin (AMOXIL) 500 MG capsule Take 1 capsule (500 mg total) by mouth 2 (two) times daily. 06/08/19   Ward, Ozella Almond, PA-C  HYDROcodone-acetaminophen (NORCO/VICODIN) 5-325 MG tablet Take 2 tablets by mouth every 4 (four) hours as needed. 10/01/15   Patel-Mills, Orvil Feil, PA-C  polyethylene glycol powder (GLYCOLAX/MIRALAX) 17 GM/SCOOP powder Take 17 g by mouth daily. 05/11/19   Zigmund Gottron, NP  simethicone (GAS-X) 80 MG chewable tablet Chew 1 tablet (80 mg total) by mouth every 6 (six) hours as needed for flatulence. 05/11/19   Zigmund Gottron, NP  SPRINTEC 28 0.25-35 MG-MCG tablet Take 1 tablet by mouth daily. 09/20/15   [provider]    Family History Family History  Problem Relation Age of Onset  . Healthy Mother   . Healthy Father     Social History Social  History   Tobacco Use  . Smoking status: Never Smoker  Substance Use Topics  . Alcohol use: No  . Drug use: Not on file     Allergies   Patient has no known allergies.   Review of Systems Review of Systems  Constitutional: Positive for chills and fever.  HENT: Positive for sore throat.   All other systems reviewed and are negative.    Physical Exam Updated Vital Signs BP 124/86   Pulse 88   Temp (!) 101.2 F (38.4 C) (Oral)   Resp 14   LMP 05/24/2019   SpO2 99%   Physical Exam Vitals signs and nursing note reviewed.  Constitutional:      General: She is not in acute distress.    Appearance: She is well-developed.  HENT:     Head: Normocephalic and atraumatic.     Mouth/Throat:     Comments: Oropharynx with erythema. Tonsillar hypertrophy and exudates noted. Midline uvula.  No peritonsillar abscess. Neck:     Musculoskeletal: Neck supple.  Cardiovascular:     Rate and Rhythm: Normal rate and regular rhythm.     Heart sounds: Normal heart sounds. No murmur.  Pulmonary:     Effort: Pulmonary effort is normal. No respiratory distress.     Breath sounds: Normal breath sounds.     Comments: Lungs clear to auscultation bilaterally. Abdominal:  General: There is no distension.     Palpations: Abdomen is soft.     Tenderness: There is no abdominal tenderness.     Comments: No abdominal, flank or CVA tenderness.  Skin:    General: Skin is warm and dry.  Neurological:     Mental Status: She is alert and oriented to person, place, and time.      ED Treatments / Results  Labs (all labs ordered are listed, but only abnormal results are displayed) Labs Reviewed  URINALYSIS, ROUTINE W REFLEX MICROSCOPIC - Abnormal; Notable for the following components:      Result Value   APPearance HAZY (*)    Protein, ur 30 (*)    Leukocytes,Ua SMALL (*)    Bacteria, UA RARE (*)    All other components within normal limits  GROUP A STREP BY PCR  NOVEL CORONAVIRUS, NAA  (HOSP ORDER, SEND-OUT TO REF LAB; TAT 18-24 HRS)    EKG None  Radiology No results found.  Procedures Procedures (including critical care time)  Medications Ordered in ED Medications  acetaminophen (TYLENOL) tablet 650 mg (650 mg Oral Given 06/08/19 1826)     Initial Impression / Assessment and Plan / ED Course  I have reviewed the triage vital signs and the nursing notes.  Pertinent labs & imaging results that were available during my care of the patient were reviewed by me and considered in my medical decision making (see chart for details).       Aimee Mills is a 28 y.o. female who presents to ED for sore throat, nasal congestion and fever.  On exam, patient with temperature of 101.2.  Clear lung exam and benign abdominal exam.  Oropharynx with tonsillar hypertrophy and exudates.  Strep test was negative.  Given exam findings and fever, will treat with amoxicillin.  Coronavirus test pending.  Patient aware she will be notified if results are positive.  Reasons to return the emergency department were discussed and all questions answered.   Final Clinical Impressions(s) / ED Diagnoses   Final diagnoses:  Exudative pharyngitis    ED Discharge Orders         Ordered    amoxicillin (AMOXIL) 500 MG capsule  2 times daily     06/08/19 2019           Ward, Ozella Almond, PA-C 06/08/19 2025    Sherwood Gambler, MD 06/09/19 [phone]

## 2019-06-08 NOTE — Discharge Instructions (Addendum)
It was my pleasure taking care of you today!   Please take all of your antibiotics until finished!  Alternate between Tylenol and ibuprofen as needed for pain/fever.  You were tested for coronavirus.  You will be notified if your results are positive.  Follow-up with your primary care doctor if you are not feeling better in few days.  Return to the emergency department for new or worsening symptoms, any additional concerns.

## 2019-06-09 LAB — GC/CHLAMYDIA PROBE AMP (~~LOC~~) NOT AT ARMC
Chlamydia: NEGATIVE
Neisseria Gonorrhea: NEGATIVE

## 2019-06-10 LAB — NOVEL CORONAVIRUS, NAA (HOSP ORDER, SEND-OUT TO REF LAB; TAT 18-24 HRS): SARS-CoV-2, NAA: NOT DETECTED
